# Patient Record
Sex: Male | Born: 1970 | Race: White | Hispanic: No | Smoking: Never smoker
Health system: Southern US, Community
[De-identification: ages and names within clinical notes are randomized; demographics above are authoritative.]

## PROBLEM LIST (undated history)

## (undated) DIAGNOSIS — F329 Major depressive disorder, single episode, unspecified: Secondary | ICD-10-CM

## (undated) DIAGNOSIS — K219 Gastro-esophageal reflux disease without esophagitis: Secondary | ICD-10-CM

## (undated) DIAGNOSIS — F32A Depression, unspecified: Secondary | ICD-10-CM

## (undated) HISTORY — DX: Depression, unspecified: F32.A

## (undated) HISTORY — DX: Major depressive disorder, single episode, unspecified: F32.9

## (undated) HISTORY — DX: Gastro-esophageal reflux disease without esophagitis: K21.9

## (undated) HISTORY — PX: WISDOM TOOTH EXTRACTION: SHX21

---

## 2015-12-11 ENCOUNTER — Telehealth: Payer: Self-pay

## 2015-12-11 NOTE — Telephone Encounter (Signed)
Pre visit call completed 

## 2015-12-12 ENCOUNTER — Encounter: Payer: Self-pay | Admitting: Family Medicine

## 2015-12-12 ENCOUNTER — Ambulatory Visit (INDEPENDENT_AMBULATORY_CARE_PROVIDER_SITE_OTHER): Payer: Commercial Managed Care - PPO | Admitting: Family Medicine

## 2015-12-12 VITALS — BP 124/70 | HR 85 | Temp 98.3°F | Ht 71.0 in | Wt 193.0 lb

## 2015-12-12 DIAGNOSIS — Z13220 Encounter for screening for lipoid disorders: Secondary | ICD-10-CM

## 2015-12-12 DIAGNOSIS — E663 Overweight: Secondary | ICD-10-CM

## 2015-12-12 DIAGNOSIS — F411 Generalized anxiety disorder: Secondary | ICD-10-CM | POA: Diagnosis not present

## 2015-12-12 LAB — COMPREHENSIVE METABOLIC PANEL
ALT: 11 U/L (ref 0–53)
AST: 11 U/L (ref 0–37)
Albumin: 4.3 g/dL (ref 3.5–5.2)
Alkaline Phosphatase: 35 U/L — ABNORMAL LOW (ref 39–117)
BUN: 10 mg/dL (ref 6–23)
CALCIUM: 9.3 mg/dL (ref 8.4–10.5)
CHLORIDE: 103 meq/L (ref 96–112)
CO2: 32 meq/L (ref 19–32)
CREATININE: 1.12 mg/dL (ref 0.40–1.50)
GFR: 75.03 mL/min (ref >60.00)
Glucose, Bld: 107 mg/dL — ABNORMAL HIGH (ref 70–99)
POTASSIUM: 4 meq/L (ref 3.5–5.1)
SODIUM: 141 meq/L (ref 135–145)
Total Bilirubin: 0.7 mg/dL (ref 0.2–1.2)
Total Protein: 7 g/dL (ref 6.0–8.3)

## 2015-12-12 LAB — LDL CHOLESTEROL, DIRECT: Direct LDL: 130 mg/dL

## 2015-12-12 LAB — LIPID PANEL
Cholesterol: 198 mg/dL (ref 0–200)
HDL: 36.4 mg/dL — AB (ref >39.00)
NONHDL: 161.82
TRIGLYCERIDES: 204 mg/dL — AB (ref 0.0–149.0)
Total CHOL/HDL Ratio: 5
VLDL: 40.8 mg/dL — AB (ref 0.0–40.0)

## 2015-12-12 MED ORDER — ESCITALOPRAM OXALATE 20 MG PO TABS: 20.0000 mg | ORAL_TABLET | Freq: Every day | ORAL | 3 refills | Status: DC

## 2015-12-12 MED ORDER — BUSPIRONE HCL 7.5 MG PO TABS: 7.5000 mg | ORAL_TABLET | Freq: Two times a day (BID) | ORAL | 1 refills | Status: DC

## 2015-12-12 NOTE — Progress Notes (Signed)
Chief Complaint  Patient presents with  . Establish Care    refill Lexapro       New Patient Visit SUBJECTIVE: HPI: William Alvarez is an 45 y.o.male who is being seen for establishing care.  The patient was previously seen at an office in [facility]. Recently moved.  Hx of anxiety for past 2-3 years. Takes Lexapro 20 mg daily for it, well controlled overall. Will have anxiety creep in 2-3 times per week where he wants something done for it. He has failed Zoloft in the past. +fam hx of depression with uncle. He does not follow with a counselor, psychologist or psychiatrist. No thoughts of harming self or others. He does not have panic attacks. He does not self-medicate with rx meds, illicit drugs or alcohol.   No Known Allergies  Past Medical History:  Diagnosis Date  . Depression   . GERD (gastroesophageal reflux disease)    Past Surgical History:  Procedure Laterality Date  . WISDOM TOOTH EXTRACTION     Social History   Social History  . Marital status: Divorced   Social History Main Topics  . Smoking status: Never Smoker  . Smokeless tobacco: Never Used  . Alcohol use Yes     Comment: socially  . Drug use:     Types: Methylphenidate   Family History  Problem Relation Age of Onset  . Alcohol abuse Mother   . Hypertension Mother   . Prostate cancer Father   . Alcohol abuse Father   . Hypertension Father   . Hypertension Maternal Grandmother   . Hypertension Maternal Grandfather   . Hypertension Paternal Grandmother   . Hypertension Paternal Grandfather      Current Outpatient Prescriptions:  .  escitalopram (LEXAPRO) 20 MG tablet, Take 1 tablet (20 mg total) by mouth daily., Disp: 90 tablet, Rfl: 3 .  esomeprazole (NEXIUM) 20 MG capsule, Take 20 mg by mouth daily at 12 noon., Disp: , Rfl:  .  busPIRone (BUSPAR) 7.5 MG tablet, Take 1 tablet (7.5 mg total) by mouth 2 (two) times daily., Disp: 60 tablet, Rfl: 1  ROS Cardiovascular: Denies chest pain  Respiratory: Denies  dyspnea   OBJECTIVE: BP 124/70 (BP Location: Left Arm, Patient Position: Sitting, Cuff Size: Small)   Pulse 85   Temp 98.3 F (36.8 C) (Oral)   Ht 5\' 11"  (1.803 m)   Wt 193 lb (87.5 kg)   SpO2 98%   BMI 26.92 kg/m   Constitutional: -  VS reviewed -  Well developed, well nourished, appears stated age -  No apparent distress  Psychiatric: -  Oriented to person, place, and time -  Memory intact -  Affect and mood normal -  Fluent conversation, good eye contact -  Judgment and insight age appropriate  Eye: -  Conjunctivae clear, no discharge -  Pupils symmetric, round, reactive to light  ENMT: -  Ears are patent b/l without erythema or discharge. TM's are shiny and clear b/l without evidence of effusion or infection. -  Oral mucosa without lesions, tongue and uvula midline    Tonsils not enlarged, no erythema, no exudate, trachea midline    Pharynx moist, no lesions, no erythema  Neck: -  No gross swelling, no palpable masses -  Thyroid midline, not enlarged, mobile, no palpable masses  Cardiovascular: -  RRR, no murmurs -  No LE edema  Respiratory: -  Normal respiratory effort, no accessory muscle use, no retraction -  Breath sounds equal, no wheezes, no  ronchi, no crackles  Musculoskeletal: -  No clubbing, no cyanosis -  Gait normal  Skin: -  No significant lesion on inspection -  Warm and dry to palpation   ASSESSMENT/PLAN: Generalized anxiety disorder - Plan: busPIRone (BUSPAR) 7.5 MG tablet, escitalopram (LEXAPRO) 20 MG tablet  Overweight (BMI 25.0-29.9) - Plan: Comprehensive metabolic panel  Screening cholesterol level - Plan: Lipid panel  Orders as above. Add Buspar. Number for counseling given. Patient should return in 1 mo- could go up on Buspar or try SNRI. The patient voiced understanding and agreement to the plan.   Jilda Rocheicholas Paul Albert LeaWendling, DO 12/12/15  11:00 AM

## 2015-12-12 NOTE — Progress Notes (Signed)
Pre visit review using our clinic review tool, if applicable. No additional management support is needed unless otherwise documented below in the visit note. 

## 2015-12-12 NOTE — Patient Instructions (Signed)
Please consider counseling. The medical literature and evidence-based guidelines support it. Contact [phone] to schedule an appointment or inquire about cost/insurance coverage.  

## 2016-02-11 ENCOUNTER — Other Ambulatory Visit: Payer: Self-pay | Admitting: Family Medicine

## 2016-02-11 DIAGNOSIS — F411 Generalized anxiety disorder: Secondary | ICD-10-CM

## 2016-02-11 NOTE — Telephone Encounter (Signed)
I have refilled Buspirone and sent Rx to [facility] pharmacy. TL/CMA

## 2016-02-21 ENCOUNTER — Encounter: Payer: Self-pay | Admitting: Family Medicine

## 2016-03-04 ENCOUNTER — Telehealth: Payer: Self-pay

## 2016-03-04 NOTE — Telephone Encounter (Signed)
Follow up call made to patient regarding Buspar. Left message for return call.

## 2016-03-04 NOTE — Telephone Encounter (Signed)
Returning call.

## 2016-03-05 NOTE — Telephone Encounter (Signed)
Patient returned my call. Called medication in to pharmacy. Advised patient to call pharmacy before he went to pick up. Patient agreed.

## 2016-03-05 NOTE — Telephone Encounter (Signed)
Called patient. Left message regarding Buspar. Checking to see if he was able to get medication.

## 2016-05-04 ENCOUNTER — Other Ambulatory Visit: Payer: Self-pay | Admitting: Family Medicine

## 2016-07-04 ENCOUNTER — Telehealth: Payer: Self-pay | Admitting: *Deleted

## 2016-07-04 DIAGNOSIS — F411 Generalized anxiety disorder: Secondary | ICD-10-CM

## 2016-07-04 NOTE — Telephone Encounter (Signed)
Requesting Buspirone HCL 7.5mg -Take 1 tablet by mouth 2 times daily. Last filled:02/11/16;#60,1 Last OV:12/22/15 Please advise.//AB/CMA

## 2016-07-04 NOTE — Telephone Encounter (Signed)
Ok to give 30 days, needs appt at least for a 6 mo check, he was supposed to follow up in 1 mo a while ago. TY.

## 2016-07-07 MED ORDER — BUSPIRONE HCL 7.5 MG PO TABS: 7.5000 mg | ORAL_TABLET | Freq: Two times a day (BID) | ORAL | 0 refills | Status: DC

## 2016-07-07 NOTE — Addendum Note (Signed)
Addended by: Verdie ShireBAYNES, ANGELA M on: 07/07/2016 10:07 AM   Modules accepted: Orders

## 2016-07-07 NOTE — Telephone Encounter (Signed)
Rx approved and sent to the pharmacy by e-script.  Called and spoke with the pt and informed him of the message below.  Pt verbalized understanding and agreed.  Pt was scheduled for follow-up for (Wed-07/16/16 @ 1:15pm).//AB/CMA

## 2016-07-16 ENCOUNTER — Ambulatory Visit: Payer: Commercial Managed Care - PPO | Admitting: Family Medicine

## 2016-07-23 ENCOUNTER — Ambulatory Visit (HOSPITAL_BASED_OUTPATIENT_CLINIC_OR_DEPARTMENT_OTHER)
Admission: RE | Admit: 2016-07-23 | Discharge: 2016-07-23 | Disposition: A | Discharge status: Home / Self Care | Payer: Commercial Managed Care - PPO | Source: Ambulatory Visit | Attending: Family Medicine | Admitting: Family Medicine

## 2016-07-23 ENCOUNTER — Ambulatory Visit (INDEPENDENT_AMBULATORY_CARE_PROVIDER_SITE_OTHER): Payer: Commercial Managed Care - PPO | Admitting: Family Medicine

## 2016-07-23 ENCOUNTER — Encounter: Payer: Self-pay | Admitting: Family Medicine

## 2016-07-23 VITALS — BP 120/70 | HR 104 | Temp 98.4°F | Ht 71.0 in | Wt 197.4 lb

## 2016-07-23 DIAGNOSIS — F411 Generalized anxiety disorder: Secondary | ICD-10-CM | POA: Diagnosis not present

## 2016-07-23 DIAGNOSIS — M25512 Pain in left shoulder: Secondary | ICD-10-CM | POA: Insufficient documentation

## 2016-07-23 DIAGNOSIS — S4992XA Unspecified injury of left shoulder and upper arm, initial encounter: Secondary | ICD-10-CM | POA: Diagnosis not present

## 2016-07-23 MED ORDER — BUSPIRONE HCL 7.5 MG PO TABS: 7.5000 mg | ORAL_TABLET | Freq: Two times a day (BID) | ORAL | 1 refills | Status: DC

## 2016-07-23 MED ORDER — PREDNISONE 10 MG (21) PO TBPK: ORAL_TABLET | ORAL | 0 refills | Status: DC

## 2016-07-23 MED ORDER — HYDROCODONE-ACETAMINOPHEN 5-325 MG PO TABS: ORAL_TABLET | ORAL | 0 refills | Status: DC

## 2016-07-23 NOTE — Progress Notes (Signed)
Musculoskeletal Exam  Patient: William Alvarez DOB: 05-Mar-1970  DOS: 07/23/2016  SUBJECTIVE:  Chief Complaint:   Chief Complaint  Patient presents with  . Follow-up    6 mos on medication  . Shoulder Injury    (L)-fell back and hit elbow-last Friday    William Alvarez is a 46 y.o.  male for evaluation and treatment of L shoulder pain.   Onset:  5 days ago.  Fell on elbow and having L shoulder pain.  Location: Top of L shoulder Character:  dull, achy Progression of issue:  is unchanged Associated symptoms: Decreased range of motion Treatment: to date has been OTC NSAIDS and acetaminophen.   Neurovascular symptoms: no  Anxiety Patient was seen in around 6 months ago and was taking Lexapro 20 mg daily. He was tolerating this well but felt he needed more relief. He was started BuSpar 7.5 mg BID. He feels much improved from this. He wishes to continue on his current regimen. No thoughts of harming himself or others. No self medication.  ROS:  Musculoskeletal/Extremities: +L shoulder pain  Neurologic: no numbness, tingling no weakness   Past Medical History:  Diagnosis Date  . Depression   . GERD (gastroesophageal reflux disease)    Past Surgical History:  Procedure Laterality Date  . WISDOM TOOTH EXTRACTION     Family History  Problem Relation Age of Onset  . Alcohol abuse Mother   . Hypertension Mother   . Prostate cancer Father   . Alcohol abuse Father   . Hypertension Father   . Hypertension Maternal Grandmother   . Hypertension Maternal Grandfather   . Hypertension Paternal Grandmother   . Hypertension Paternal Grandfather    Current Outpatient Prescriptions  Medication Sig Dispense Refill  . busPIRone (BUSPAR) 7.5 MG tablet Take 1 tablet (7.5 mg total) by mouth 2 (two) times daily. 30 tablet 0  . escitalopram (LEXAPRO) 20 MG tablet Take 1 tablet (20 mg total) by mouth daily. 90 tablet 3  . esomeprazole (NEXIUM) 20 MG capsule Take 20 mg by mouth daily at 12 noon.      No Known Allergies Social History   Social History  . Marital status: Divorced   Social History Main Topics  . Smoking status: Never Smoker  . Smokeless tobacco: Never Used  . Alcohol use Yes     Comment: socially  . Drug use: Yes    Types: Methylphenidate   Objective: VITAL SIGNS: BP 120/70 (BP Location: Left Arm, Patient Position: Sitting, Cuff Size: Normal)   Pulse (!) 104   Temp 98.4 F (36.9 C) (Oral)   Ht 5\' 11"  (1.803 m)   Wt 197 lb 6.4 oz (89.5 kg)   SpO2 98%   BMI 27.53 kg/m  Constitutional: Well formed, well developed. No acute distress. Cardiovascular: Brisk cap refill Thorax & Lungs: No accessory muscle use Extremities: No clubbing. No cyanosis. No edema.  Skin: Warm. Dry. No erythema. No rash.  Musculoskeletal: L shoulder- very limited due to pain.   Normal active range of motion: no.   Normal passive range of motion: no Tenderness to palpation: yes Deformity: no Ecchymosis: no Tests positive: +Neer's, Cross over, Hawkins, Empty can Tests negative: Lift off, Speed's, Obriens and cross over Obrien's Neurologic: Normal sensory function. No focal deficits noted. DTR's equal and symmetry in UE's. No clonus. Psychiatric: Normal mood. Age appropriate judgment and insight. Alert & oriented x 3.    Assessment:  Acute pain of left shoulder - Plan: DG Shoulder Left,  predniSONE (STERAPRED UNI-PAK 21 TAB) 10 MG (21) TBPK tablet, HYDROcodone-acetaminophen (NORCO/VICODIN) 5-325 MG tablet  Generalized anxiety disorder - Plan: busPIRone (BUSPAR) 7.5 MG tablet  Plan: Orders as above. Norco for breakthrough pain only. XR neg. F/u in 1 week if no improvement and we will consider injection vs referral to Sports medicine. F/u in 6 mo otherwise. The patient voiced understanding and agreement to the plan.   Jilda Rocheicholas Paul [place], DO 07/23/16  10:35 AM

## 2016-07-23 NOTE — Patient Instructions (Addendum)
EXERCISES  RANGE OF MOTION (ROM) AND STRETCHING EXERCISES These exercises may help you when beginning to rehabilitate your injury. Your symptoms may resolve with or without further involvement from your physician, physical therapist or athletic trainer. While completing these exercises, remember:   Restoring tissue flexibility helps normal motion to return to the joints. This allows healthier, less painful movement and activity.  An effective stretch should be held for at least 30 seconds.  A stretch should never be painful. You should only feel a gentle lengthening or release in the stretched tissue.  ROM - Pendulum  Bend at the waist so that your right / left arm falls away from your body. Support yourself with your opposite hand on a solid surface, such as a table or a countertop.  Your right / left arm should be perpendicular to the ground. If it is not perpendicular, you need to lean over farther. Relax the muscles in your right / left arm and shoulder as much as possible.  Gently sway your hips and trunk so they move your right / left arm without any use of your right / left shoulder muscles.  Progress your movements so that your right / left arm moves side to side, then forward and backward, and finally, both clockwise and counterclockwise.  Complete 10-15 repetitions in each direction. Many people use this exercise to relieve discomfort in their shoulder as well as to gain range of motion. Repeat 2-3 times. Complete this exercise once per day.  STRETCH - Flexion, Standing  Stand with good posture. With an underhand grip on your right / left hand and an overhand grip on the opposite hand, grasp a broomstick or cane so that your hands are a little more than shoulder-width apart.  Keeping your right / left elbow straight and shoulder muscles relaxed, push the stick with your opposite hand to raise your right / left arm in front of your body and then overhead. Raise your arm until you  feel a stretch in your right / left shoulder, but before you have increased shoulder pain.  Try to avoid shrugging your right / left shoulder as your arm rises by keeping your shoulder blade tucked down and toward your mid-back spine. Hold 15-20 seconds.  Slowly return to the starting position. Repeat 2-3 times. Complete this exercise once per day.  STRETCH - Internal Rotation  Place your right / left hand behind your back, palm-up.  Throw a towel or belt over your opposite shoulder. Grasp the towel/belt with your right / left hand.  While keeping an upright posture, gently pull up on the towel/belt until you feel a stretch in the front of your right / left shoulder.  Avoid shrugging your right / left shoulder as your arm rises by keeping your shoulder blade tucked down and toward your mid-back spine.  Hold 15-20. Release the stretch by lowering your opposite hand. Repeat 2-3 times. Complete this exercise once per day.  STRETCH - External Rotation and Abduction  Stagger your stance through a doorframe. It does not matter which foot is forward.  As instructed by your physician, physical therapist or athletic trainer, place your hands: ? And forearms above your head and on the door frame. ? And forearms at head-height and on the door frame. ? At elbow-height and on the door frame.  Keeping your head and chest upright and your stomach muscles tight to prevent over-extending your low-back, slowly shift your weight onto your front foot until you feel a   stretch across your chest and/or in the front of your shoulders.  Hold 15-20 seconds. Shift your weight to your back foot to release the stretch. Repeat 2-3 times. Complete this stretch once per day.   STRENGTHENING EXERCISES  These exercises may help you when beginning to rehabilitate your injury. They may resolve your symptoms with or without further involvement from your physician, physical therapist or athletic trainer. While  completing these exercises, remember:   Muscles can gain both the endurance and the strength needed for everyday activities through controlled exercises.  Complete these exercises as instructed by your physician, physical therapist or athletic trainer. Progress the resistance and repetitions only as guided.  You may experience muscle soreness or fatigue, but the pain or discomfort you are trying to eliminate should never worsen during these exercises. If this pain does worsen, stop and make certain you are following the directions exactly. If the pain is still present after adjustments, discontinue the exercise until you can discuss the trouble with your clinician.  If advised by your physician, during your recovery, avoid activity or exercises which involve actions that place your right / left hand or elbow above your head or behind your back or head. These positions stress the tissues which are trying to heal.  STRENGTH - Scapular Depression and Adduction  With good posture, sit on a firm chair. Supported your arms in front of you with pillows, arm rests or a table top. Have your elbows in line with the sides of your body.  Gently draw your shoulder blades down and toward your mid-back spine. Gradually increase the tension without tensing the muscles along the top of your shoulders and the back of your neck.  Hold for 5 seconds. Slowly release the tension and relax your muscles completely before completing the next repetition.  After you have practiced this exercise, remove the arm support and complete it in standing as well as sitting. Repeat 2 times. Complete this exercise every other day.   STRENGTH - External Rotators  Secure a rubber exercise band/tubing to a fixed object so that it is at the same height as your right / left elbow when you are standing or sitting on a firm surface.  Stand or sit so that the secured exercise band/tubing is at your side that is not injured.  Bend your  elbow 90 degrees. Place a folded towel or small pillow under your right / left arm so that your elbow is a few inches away from your side.  Keeping the tension on the exercise band/tubing, pull it away from your body, as if pivoting on your elbow. Be sure to keep your body steady so that the movement is only coming from your shoulder rotating.  Hold 5 seconds. Release the tension in a controlled manner as you return to the starting position. Repeat 2 times. Complete this exercise every other day.   STRENGTH - Supraspinatus  Stand or sit with good posture. Grasp a 2-3 lb weight or an exercise band/tubing so that your hand is "thumbs-up," like when you shake hands.  Slowly lift your right / left hand from your thigh into the air, traveling about 30 degrees from straight out at your side. Lift your hand to shoulder height or as far as you can without increasing any shoulder pain. Initially, many people do not lift their hands above shoulder height.  Avoid shrugging your right / left shoulder as your arm rises by keeping your shoulder blade tucked down and toward   your mid-back spine.  Hold for 4-5 seconds. Control the descent of your hand as you slowly return to your starting position. Repeat 2 times. Complete this exercise every other day.   STRENGTH - Shoulder Extensors  Secure a rubber exercise band/tubing so that it is at the height of your shoulders when you are either standing or sitting on a firm arm-less chair.  With a thumbs-up grip, grasp an end of the band/tubing in each hand. Straighten your elbows and lift your hands straight in front of you at shoulder height. Step back away from the secured end of band/tubing until it becomes tense.  Squeezing your shoulder blades together, pull your hands down to the sides of your thighs. Do not allow your hands to go behind you.  Hold for 5 seconds. Slowly ease the tension on the band/tubing as you reverse the directions and return to the  starting position. Repeat 2 times. Complete this exercise every other day.   STRENGTH - Scapular Retractors  Secure a rubber exercise band/tubing so that it is at the height of your shoulders when you are either standing or sitting on a firm arm-less chair.  With a palm-down grip, grasp an end of the band/tubing in each hand. Straighten your elbows and lift your hands straight in front of you at shoulder height. Step back away from the secured end of band/tubing until it becomes tense.  Squeezing your shoulder blades together, draw your elbows back as you bend them. Keep your upper arm lifted away from your body throughout the exercise.  Hold 5 seconds. Slowly ease the tension on the band/tubing as you reverse the directions and return to the starting position. Repeat 2 times. Complete this exercise every other day.  STRENGTH - Scapular Depressors  Find a sturdy chair without wheels, such as a from a dining room table.  Keeping your feet on the floor, lift your bottom from the seat and lock your elbows.  Keeping your elbows straight, allow gravity to pull your body weight down. Your shoulders will rise toward your ears.  Raise your body against gravity by drawing your shoulder blades down your back, shortening the distance between your shoulders and ears. Although your feet should always maintain contact with the floor, your feet should progressively support less body weight as you get stronger.  Hold 5 seconds. In a controlled and slow manner, lower your body weight to begin the next repetition. Repeat 2 times. Complete this exercise every other day.   This information is not intended to replace advice given to you by your health care provider. Make sure you discuss any questions you have with your health care provider.  Document Released: 12/04/2004 Document Revised: 02/10/2014 Document Reviewed: 05/04/2008 Elsevier Interactive Patient Education 2016 [employer]  Heat (pad or  rice pillow in microwave) over affected area, 10-15 minutes every 2-3 hours while awake.   OK to take Tylenol 1000 mg (2 extra strength tabs) or 975 mg (3 regular strength tabs) every 6 hours as needed.

## 2016-09-25 ENCOUNTER — Other Ambulatory Visit: Payer: Self-pay | Admitting: Family Medicine

## 2016-09-25 DIAGNOSIS — F411 Generalized anxiety disorder: Secondary | ICD-10-CM

## 2016-09-25 MED ORDER — BUSPIRONE HCL 7.5 MG PO TABS: 7.5000 mg | ORAL_TABLET | Freq: Two times a day (BID) | ORAL | 1 refills | Status: DC

## 2017-01-09 ENCOUNTER — Other Ambulatory Visit: Payer: Self-pay | Admitting: Family Medicine

## 2017-01-09 DIAGNOSIS — F411 Generalized anxiety disorder: Secondary | ICD-10-CM

## 2017-03-15 DIAGNOSIS — Z23 Encounter for immunization: Secondary | ICD-10-CM | POA: Diagnosis not present

## 2017-08-02 ENCOUNTER — Other Ambulatory Visit: Payer: Self-pay | Admitting: Family Medicine

## 2017-08-02 DIAGNOSIS — F411 Generalized anxiety disorder: Secondary | ICD-10-CM

## 2017-09-09 ENCOUNTER — Other Ambulatory Visit: Payer: Self-pay | Admitting: Family Medicine

## 2017-09-09 DIAGNOSIS — F411 Generalized anxiety disorder: Secondary | ICD-10-CM

## 2017-10-20 ENCOUNTER — Other Ambulatory Visit: Payer: Self-pay | Admitting: Family Medicine

## 2017-10-20 DIAGNOSIS — F411 Generalized anxiety disorder: Secondary | ICD-10-CM

## 2017-10-23 DIAGNOSIS — Z23 Encounter for immunization: Secondary | ICD-10-CM | POA: Diagnosis not present

## 2017-11-13 ENCOUNTER — Other Ambulatory Visit: Payer: Self-pay | Admitting: Family Medicine

## 2017-11-13 DIAGNOSIS — F411 Generalized anxiety disorder: Secondary | ICD-10-CM

## 2017-11-17 IMAGING — DX DG SHOULDER 2+V*L*
3 series · 3 of 3 positions shown · non-contrast
Comparison: None.

CLINICAL DATA: Pain following fall

EXAM:
LEFT SHOULDER - 2+ VIEW

[shoulder grashey]
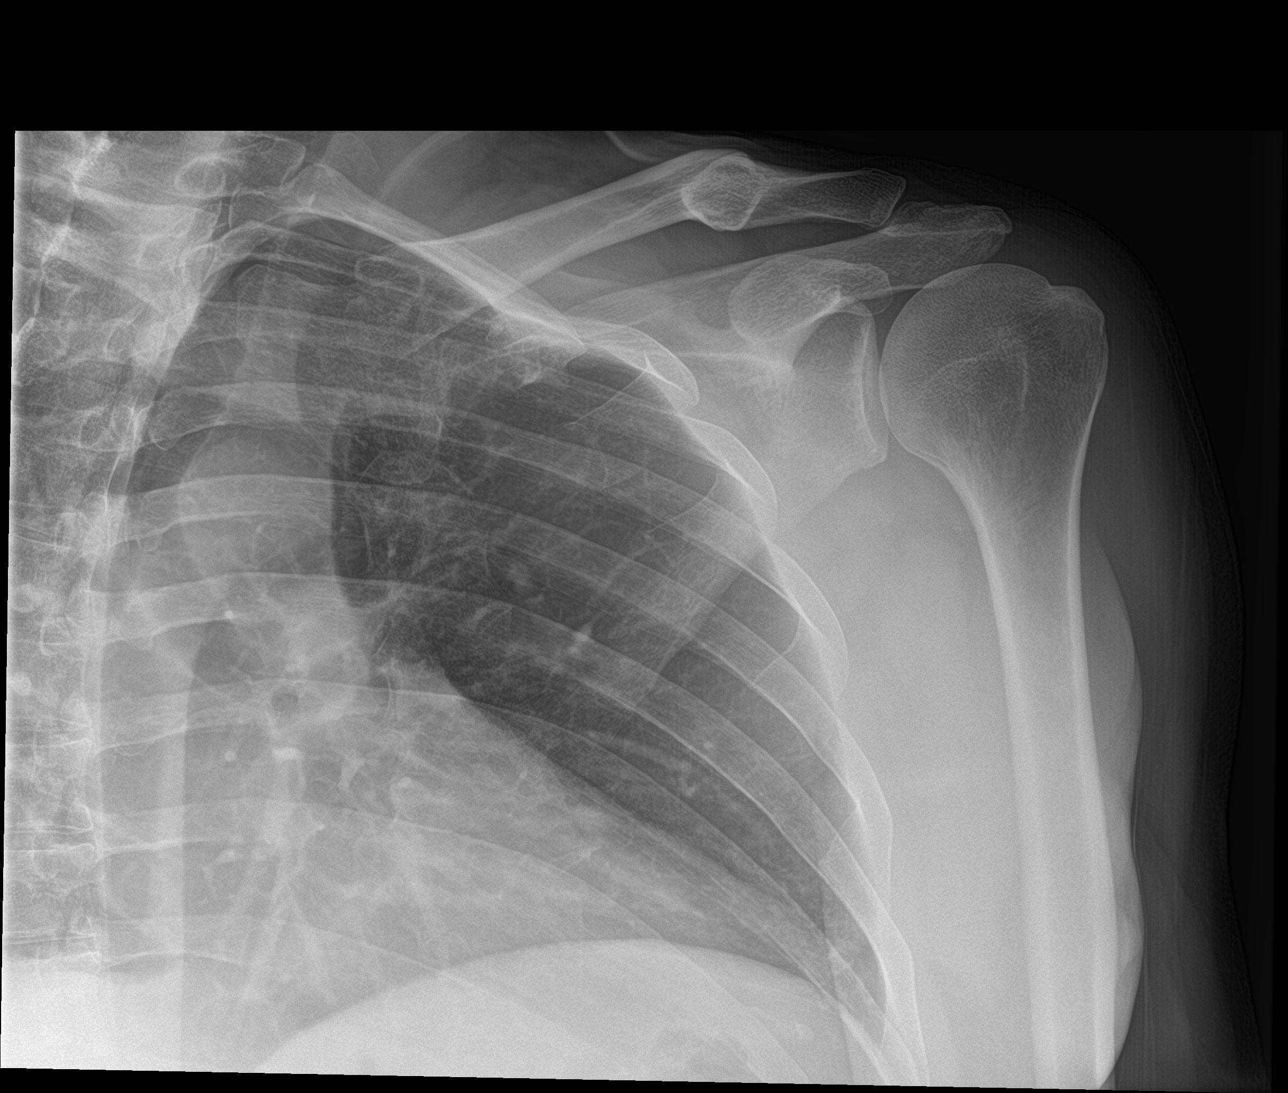

[shoulder y view]
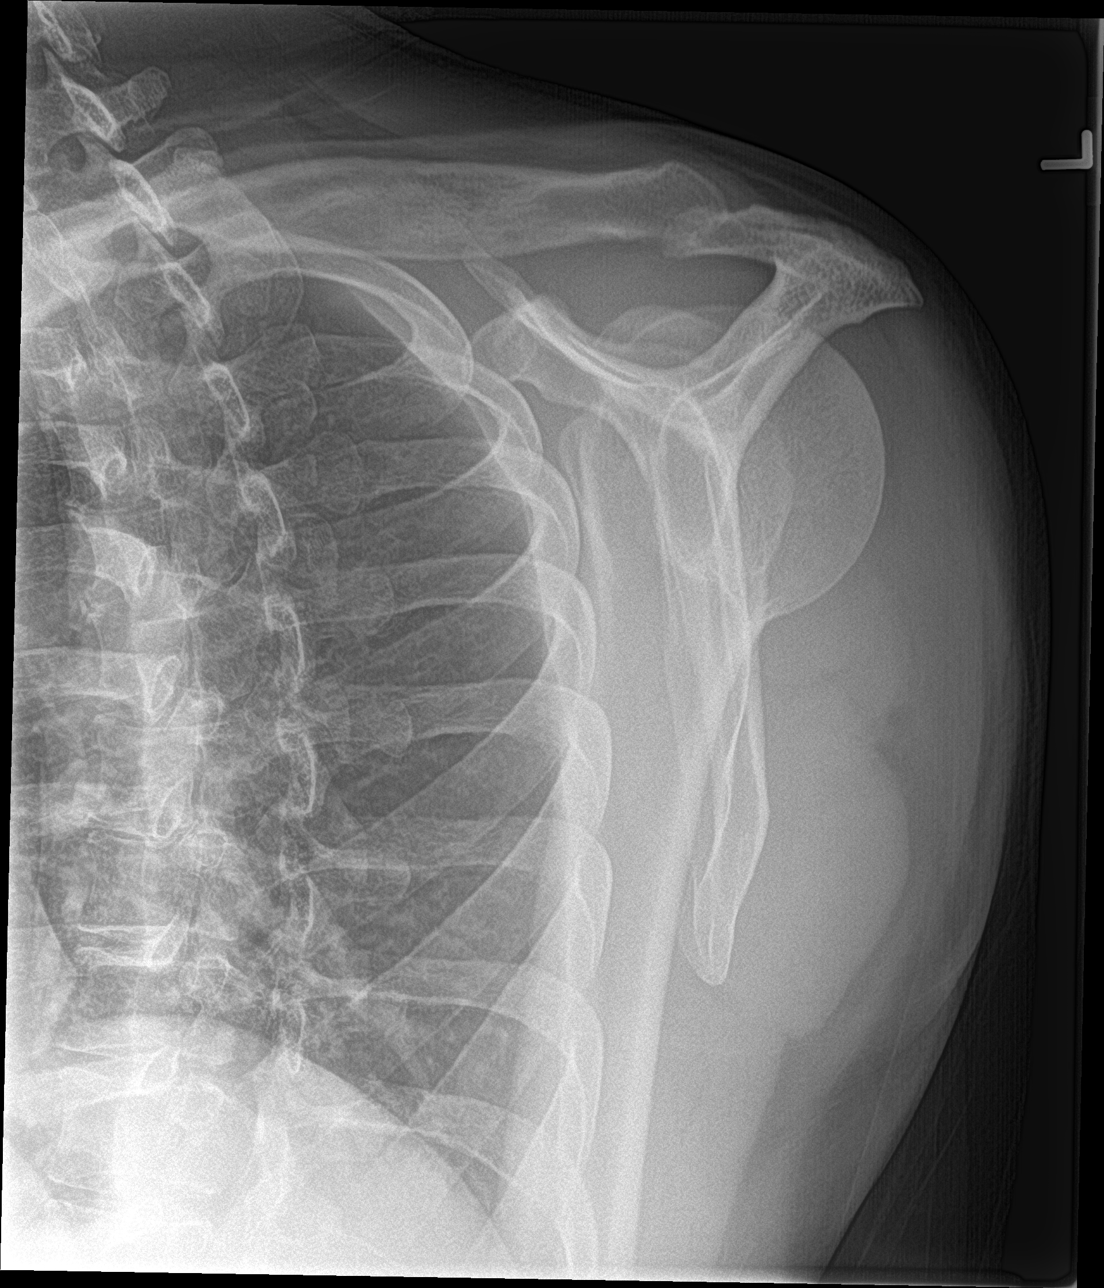

[shoulder axillary]
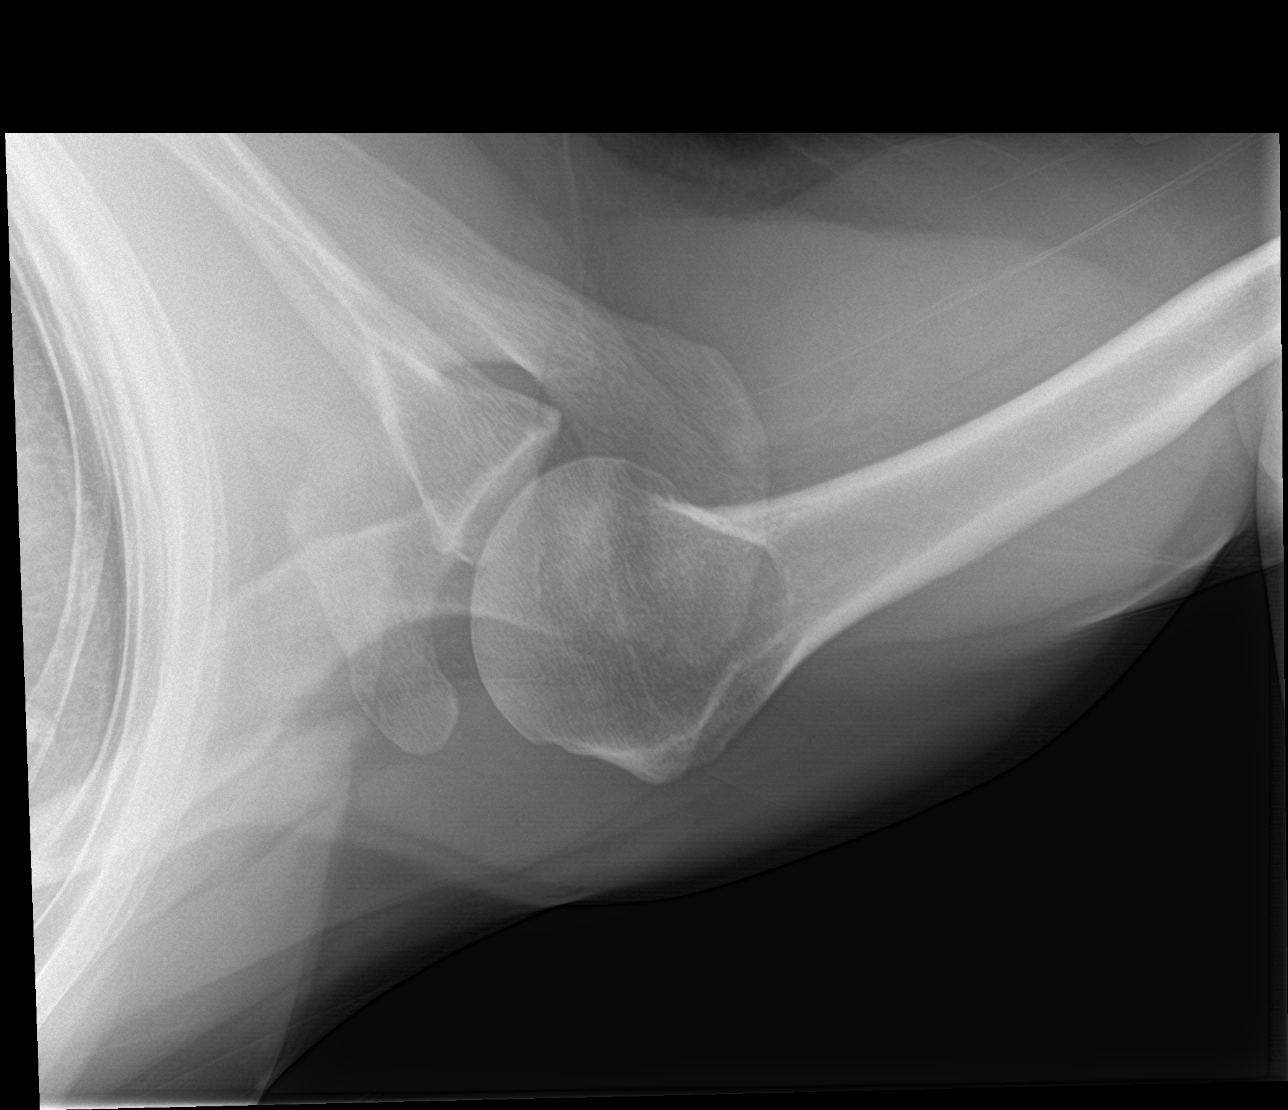

[3 of 3 positions shown; findings below may reference images not displayed]

FINDINGS: Oblique, Y scapular, and axillary images were obtained. There is no
fracture or dislocation. Joint spaces appear normal. No erosive
change. Visualized left lung is clear.
IMPRESSION: No fracture or dislocation.  No appreciable arthropathy.

## 2017-12-12 ENCOUNTER — Other Ambulatory Visit: Payer: Self-pay | Admitting: Family Medicine

## 2017-12-12 DIAGNOSIS — F411 Generalized anxiety disorder: Secondary | ICD-10-CM

## 2017-12-22 ENCOUNTER — Other Ambulatory Visit: Payer: Self-pay | Admitting: Family Medicine

## 2017-12-22 DIAGNOSIS — F411 Generalized anxiety disorder: Secondary | ICD-10-CM

## 2017-12-22 NOTE — Telephone Encounter (Signed)
Dr Carmelia RollerWendling -- Last OV 07/11/16.  Please see refill protocol failures for Buspar and advise request?

## 2017-12-23 NOTE — Telephone Encounter (Signed)
Called the patient left a detailed message of PCP instructions regarding appt/refills

## 2017-12-23 NOTE — Telephone Encounter (Signed)
Needs appt, med ck. TY.

## 2018-01-26 ENCOUNTER — Other Ambulatory Visit: Payer: Self-pay | Admitting: Family Medicine

## 2018-01-26 DIAGNOSIS — F411 Generalized anxiety disorder: Secondary | ICD-10-CM

## 2018-02-14 ENCOUNTER — Other Ambulatory Visit: Payer: Self-pay | Admitting: Family Medicine

## 2018-02-14 DIAGNOSIS — F411 Generalized anxiety disorder: Secondary | ICD-10-CM

## 2018-03-01 ENCOUNTER — Other Ambulatory Visit: Payer: Self-pay | Admitting: Family Medicine

## 2018-03-01 DIAGNOSIS — F411 Generalized anxiety disorder: Secondary | ICD-10-CM

## 2018-04-03 ENCOUNTER — Other Ambulatory Visit: Payer: Self-pay | Admitting: Family Medicine

## 2018-04-03 DIAGNOSIS — F411 Generalized anxiety disorder: Secondary | ICD-10-CM

## 2018-05-04 ENCOUNTER — Encounter: Payer: Self-pay | Admitting: Family Medicine

## 2018-05-05 ENCOUNTER — Ambulatory Visit (INDEPENDENT_AMBULATORY_CARE_PROVIDER_SITE_OTHER): Payer: Commercial Managed Care - PPO | Admitting: Family Medicine

## 2018-05-05 ENCOUNTER — Other Ambulatory Visit: Payer: Self-pay

## 2018-05-05 ENCOUNTER — Encounter: Payer: Self-pay | Admitting: Family Medicine

## 2018-05-05 DIAGNOSIS — H9202 Otalgia, left ear: Secondary | ICD-10-CM | POA: Diagnosis not present

## 2018-05-05 MED ORDER — AMOXICILLIN 875 MG PO TABS: 875.0000 mg | ORAL_TABLET | Freq: Two times a day (BID) | ORAL | 0 refills | Status: AC

## 2018-05-05 MED ORDER — PREDNISONE 20 MG PO TABS: 40.0000 mg | ORAL_TABLET | Freq: Every day | ORAL | 0 refills | Status: AC

## 2018-05-05 NOTE — Progress Notes (Signed)
Virtual Visit via Video Note  I connected with William Alvarez on 05/05/18 at 10:00 AM EDT by a video enabled telemedicine application and verified that I am speaking with the correct person using two identifiers.   I discussed the limitations of evaluation and management by telemedicine and the availability of in person appointments. The patient expressed understanding and agreed to proceed.  History of Present Illness: 1 d of L ear pain, no drainage. Tried to clean with peroxide, did not help despite getting some wax out. No drainage, fevers, URI s/s's. Does have hx of AOM as adult every 1-3 years.    Observations/Objective: No conversational dyspnea Age appropriate judgment and insight Nml affect and mood  Assessment and Plan: Left ear pain - Plan: amoxicillin (AMOXIL) 875 MG tablet, predniSONE (DELTASONE) 20 MG tablet  Cover for AOM and ETD. Will also send message with ear wax removal instructions. Let us know if anything changes.  Follow Up Instructions: PRN.    I discussed the assessment and treatment plan with the patient. The patient was provided an opportunity to ask questions and all were answered. The patient agreed with the plan and demonstrated an understanding of the instructions.   The patient was advised to call back or seek an in-person evaluation if the symptoms worsen or if the condition fails to improve as anticipated.   Jilda Roche [place], DO

## 2018-08-04 ENCOUNTER — Ambulatory Visit (INDEPENDENT_AMBULATORY_CARE_PROVIDER_SITE_OTHER): Payer: Commercial Managed Care - PPO | Admitting: Family Medicine

## 2018-08-04 ENCOUNTER — Other Ambulatory Visit: Payer: Self-pay

## 2018-08-04 ENCOUNTER — Encounter: Payer: Self-pay | Admitting: Family Medicine

## 2018-08-04 VITALS — BP 112/84 | HR 101 | Temp 98.5°F | Resp 16 | Ht 71.0 in | Wt 196.0 lb

## 2018-08-04 DIAGNOSIS — L989 Disorder of the skin and subcutaneous tissue, unspecified: Secondary | ICD-10-CM | POA: Diagnosis not present

## 2018-08-04 DIAGNOSIS — L219 Seborrheic dermatitis, unspecified: Secondary | ICD-10-CM

## 2018-08-04 DIAGNOSIS — G4700 Insomnia, unspecified: Secondary | ICD-10-CM | POA: Diagnosis not present

## 2018-08-04 MED ORDER — TRIAMCINOLONE ACETONIDE 0.1 % EX CREA: 1.0000 "application " | TOPICAL_CREAM | Freq: Two times a day (BID) | CUTANEOUS | 0 refills | Status: DC

## 2018-08-04 MED ORDER — TRAZODONE HCL 50 MG PO TABS: 25.0000 mg | ORAL_TABLET | Freq: Every evening | ORAL | 3 refills | Status: DC | PRN

## 2018-08-04 MED ORDER — KETOCONAZOLE 2 % EX SHAM: MEDICATED_SHAMPOO | CUTANEOUS | 0 refills | Status: DC

## 2018-08-04 NOTE — Progress Notes (Signed)
Chief Complaint  Patient presents with  . Skin Lesion    chest, 15+ years, no change in size or color  . Skin Dryness    William Alvarez is a 48 y.o. male here for a skin complaint.  Duration: 18 years Location: upper chest Pruritic? No Painful? No Drainage? No Other associated symptoms: none Therapies tried thus far: None  Also has been having issues with his outer ears being dry. Has tried OTC cortisone cream with some relief. It is scaly and itchy. Has been going on for many months. Had Ciprodex drops that did help, tho was rx'd for another issue. Also tried anti-dandruff tar shampoo that was not particularly helpful.   Over past several mo, since pandemic started, has been having issues staying asleep. Has tried various OTC meds without relief. Was on a couple Z drugs in past. Mind will race and he has been worrying. Hoping to get a rx for something to help.   ROS:  Const: No fevers Skin: As noted in HPI  Past Medical History:  Diagnosis Date  . Depression   . GERD (gastroesophageal reflux disease)     BP 112/84 (BP Location: Right Arm, Patient Position: Sitting, Cuff Size: Large)   Pulse (!) 101   Temp 98.5 F (36.9 C) (Oral)   Resp 16   Ht 5\' 11"  (1.803 m)   Wt 196 lb (88.9 kg)   SpO2 99%   BMI 27.34 kg/m  Gen: awake, alert, appearing stated age Lungs: No accessory muscle use Skin: See below for chest lesion. No drainage, erythema, TTP, fluctuance, excoriation; outer ears scaly without erythema Ears: Canals patent with trace amounts of wax, TM's neg b/l Psych: Age appropriate judgment and insight  Skin lesion - Plan: picture taken today. Observation, if anything changes can shave.   Seborrhea - Plan: triamcinolone cream (KENALOG) 0.1 %, ketoconazole (NIZORAL) 2 % shampoo  Insomnia, unspecified type - Plan: traZODone (DESYREL) 50 MG tablet, sleep hygiene, [facility] info given.  F/u in 4 weeks.  The patient voiced understanding and agreement to the plan.  [place], DO 08/04/18 8:07 AM

## 2018-08-04 NOTE — Patient Instructions (Signed)
Apply the cream twice daily for 10 days.  Use the shampoo 2-3 times per week until it resolves. You may need to use it weekly for maintenance.  Sleep Hygiene Tips:  Do not watch TV or look at screens within 1 hour of going to bed. If you do, make sure there is a blue light filter (nighttime mode) involved.  Try to go to bed around the same time every night. Wake up at the same time within 1 hour of regular time. Ex: If you wake up at 7 AM for work, do not sleep past 8 AM on days that you don't work.  Do not drink alcohol before bedtime.  Do not consume caffeine-containing beverages after noon or within 9 hours of intended bedtime.  Get regular exercise/physical activity in your life, but not within 2 hours of planned bedtime.  Do not take naps.   Do not eat within 2 hours of planned bedtime.  Melatonin, 3-5 mg 30-60 minutes before planned bedtime may be helpful.   The bed should be for sleep or sex only. If after 20-30 minutes you are unable to fall asleep, get up and do something relaxing. Do this until you feel ready to go to sleep again.   Sleep is important to [place] all. Getting good sleep is imperative to adequate functioning during the day. Work with our counselors who are trained to help people obtain quality sleep. Call [phone] to schedule an appointment or if you are curious about insurance coverage/cost.  Aim to do some physical exertion for 150 minutes per week. This is typically divided into 5 days per week, 30 minutes per day. The activity should be enough to get your heart rate up. Anything is better than nothing if you have time constraints.  Let us know if you need anything.

## 2018-09-06 ENCOUNTER — Ambulatory Visit (INDEPENDENT_AMBULATORY_CARE_PROVIDER_SITE_OTHER): Payer: Commercial Managed Care - PPO | Admitting: Family Medicine

## 2018-09-06 ENCOUNTER — Other Ambulatory Visit: Payer: Self-pay

## 2018-09-06 ENCOUNTER — Encounter: Payer: Self-pay | Admitting: Family Medicine

## 2018-09-06 VITALS — BP 122/74 | HR 132 | Temp 99.0°F | Ht 71.0 in | Wt 194.4 lb

## 2018-09-06 DIAGNOSIS — Z23 Encounter for immunization: Secondary | ICD-10-CM

## 2018-09-06 DIAGNOSIS — R000 Tachycardia, unspecified: Secondary | ICD-10-CM

## 2018-09-06 DIAGNOSIS — F411 Generalized anxiety disorder: Secondary | ICD-10-CM

## 2018-09-06 DIAGNOSIS — G4700 Insomnia, unspecified: Secondary | ICD-10-CM

## 2018-09-06 MED ORDER — LORAZEPAM 0.5 MG PO TABS: 0.2500 mg | ORAL_TABLET | Freq: Every day | ORAL | 0 refills | Status: DC | PRN

## 2018-09-06 NOTE — Patient Instructions (Addendum)
Keep an eye on your pulse over next few weeks. If greater than 100 please let me know.  Coping skills Choose 5 that work for you:  Take a deep breath  Count to 20  Read a book  Do a puzzle  Meditate  Bake  Sing  Knit  Garden  Pray  Go outside  Call a friend  Listen to music  Take a walk  Color  Send a note  Take a bath  Watch a movie  Be alone in a quiet place  Pet an animal  Visit a friend  Journal  Exercise  Stretch   Let us know if you need anything.

## 2018-09-06 NOTE — Progress Notes (Signed)
Chief Complaint  Patient presents with  . Follow-up    Subjective: Patient is a 48 y.o. male here for f/u insomnia.  Patient was started on trazodone 50 mg nightly.  It helps him stay asleep.  He is content with this.  His ex-wife's brother recently passed away.  This has been causing him quite a bit of stress.  He is currently taking Lexapro daily.  He is no longer taking BuSpar.  He used to take Lorazepam for peaks of anxiety.  He is not having panic attacks.  He would like to restart this.  He is not following with a counselor or psychologist.  He has a high heart rate today.  He denies any palpitations, shortness of breath, or chest pain.  He reports the stress of a death near to him is causing his heart rate to be elevated.  He has the capability to monitor this at home.   ROS: Heart: Denies chest pain  Lungs: Denies SOB   Past Medical History:  Diagnosis Date  . Depression   . GERD (gastroesophageal reflux disease)     Objective: BP 122/74 (BP Location: Left Arm, Patient Position: Sitting, Cuff Size: Normal)   Pulse (!) 132   Temp 99 F (37.2 C) (Oral)   Ht 5\' 11"  (1.803 m)   Wt 194 lb 6 oz (88.2 kg)   SpO2 95%   BMI 27.11 kg/m  General: Awake, appears stated age HEENT: MMM, EOMi Heart: Regular rhythm, tachycardic, no murmurs Lungs: CTAB, no rales, wheezes or rhonchi. No accessory muscle use Psych: Age appropriate judgment and insight, normal affect and mood  Assessment and Plan: GAD (generalized anxiety disorder) - Plan: Continue Lexapro, add Lorazepam as needed  Insomnia, unspecified type - Plan: Continue trazodone  Tachycardia - Plan: Monitor at home, if unremarkable, will obtain thyroid level, CBC, and EKG.  Need for tetanus booster - Plan: Tdap vaccine greater than or equal to 7yo IM  Follow-up in 2 months. The patient voiced understanding and agreement to the plan.  [place], DO 09/06/18  10:53 AM

## 2018-10-04 ENCOUNTER — Other Ambulatory Visit: Payer: Self-pay | Admitting: Family Medicine

## 2018-10-05 NOTE — Telephone Encounter (Signed)
Last OV--09/06/2018 Last RF---09/06/2018---#30 no refills

## 2018-11-01 ENCOUNTER — Other Ambulatory Visit: Payer: Self-pay | Admitting: Family Medicine

## 2018-11-02 NOTE — Telephone Encounter (Signed)
Requesting:  Lorazepam Contract:   NONE UDS:   NONE Last Visit:   09/06/2018 Next Visit:   NONE Last Refill:   10/05/2018   #30 no refills.  Please Advise

## 2018-11-08 ENCOUNTER — Ambulatory Visit: Payer: Commercial Managed Care - PPO | Admitting: Family Medicine

## 2018-11-26 ENCOUNTER — Other Ambulatory Visit: Payer: Self-pay | Admitting: Family Medicine

## 2018-11-26 DIAGNOSIS — G4700 Insomnia, unspecified: Secondary | ICD-10-CM

## 2018-11-26 NOTE — Telephone Encounter (Signed)
Requesting:   Trazodone Contract:   none UDS:   none Last Visit:   09/06/2018 Next Visit:  none Last Refill:   04/10/2019-----#30 with 3 refills.  Please Advise

## 2018-12-14 ENCOUNTER — Other Ambulatory Visit: Payer: Self-pay | Admitting: Family Medicine

## 2018-12-15 NOTE — Telephone Encounter (Signed)
Requesting:   Lorazepam Contract:   None    UDS:     None Last Visit:    09/06/2018 Next Visit:     None Last Refill:    11/02/2018  #30 no refills  Please Advise

## 2018-12-16 NOTE — Telephone Encounter (Signed)
Will refill but he was supposed to follow up in early October. Please schedule his f/u appt. Ty.

## 2018-12-22 ENCOUNTER — Other Ambulatory Visit: Payer: Self-pay | Admitting: Family Medicine

## 2018-12-22 DIAGNOSIS — G4700 Insomnia, unspecified: Secondary | ICD-10-CM

## 2019-01-10 ENCOUNTER — Other Ambulatory Visit: Payer: Self-pay | Admitting: Family Medicine

## 2019-01-10 DIAGNOSIS — L219 Seborrheic dermatitis, unspecified: Secondary | ICD-10-CM

## 2019-01-14 ENCOUNTER — Other Ambulatory Visit: Payer: Self-pay | Admitting: Family Medicine

## 2019-01-17 NOTE — Telephone Encounter (Signed)
Called informed the patient of PCP instructions. He stated he would schedule on line after the Christmas. He verbalized understanding.

## 2019-01-17 NOTE — Telephone Encounter (Signed)
Requesting:   Lorazepam Contract:  none UDS:    None Last Visit:    12/16/2018 Next Visit:    none Last Refill:     12/16/2018  Please Advise

## 2019-01-17 NOTE — Telephone Encounter (Signed)
Needs appt. Refilled some in meantime. Ty.

## 2019-01-20 ENCOUNTER — Other Ambulatory Visit: Payer: Self-pay | Admitting: Family Medicine

## 2019-01-20 DIAGNOSIS — G4700 Insomnia, unspecified: Secondary | ICD-10-CM

## 2019-01-20 NOTE — Telephone Encounter (Signed)
Last OV 09/06/18 Last refill 11/26/18 #30/3 Next OV not scheduled

## 2019-01-24 ENCOUNTER — Encounter: Payer: Self-pay | Admitting: Family Medicine

## 2019-01-31 ENCOUNTER — Encounter: Payer: Self-pay | Admitting: Family Medicine

## 2019-01-31 ENCOUNTER — Other Ambulatory Visit: Payer: Self-pay

## 2019-02-01 ENCOUNTER — Ambulatory Visit (INDEPENDENT_AMBULATORY_CARE_PROVIDER_SITE_OTHER): Payer: Commercial Managed Care - PPO | Admitting: Family Medicine

## 2019-02-01 ENCOUNTER — Encounter: Payer: Self-pay | Admitting: Family Medicine

## 2019-02-01 DIAGNOSIS — G4700 Insomnia, unspecified: Secondary | ICD-10-CM

## 2019-02-01 DIAGNOSIS — F411 Generalized anxiety disorder: Secondary | ICD-10-CM | POA: Diagnosis not present

## 2019-02-01 DIAGNOSIS — J069 Acute upper respiratory infection, unspecified: Secondary | ICD-10-CM | POA: Diagnosis not present

## 2019-02-01 NOTE — Progress Notes (Signed)
No chief complaint on file.   Subjective: Patient is a 48 y.o. male here for f/u. Due to COVID-19 pandemic, we are interacting via web portal for an electronic face-to-face visit. I verified patient's ID using 2 identifiers. Patient agreed to proceed with visit via this method. Patient is at home, I am at office. Patient and I are present for visit.   Pt got sick around 8 d ago. Got tested 2x and was neg each time. Felt better after 1-2 d. Needs note to allow him to return to work. Never had a fever. Had general URI s/s's.  GAD Hx of GAD w insomnia. Compliant w Lexapro 20 mg/d. No AE's. Working well overall. Takes 0.5 mg of Ativan prn, usually more than 10 tabs/mo on average. He is taking trazodone in the evenings for sleep and this seems to be working well. Not currently following with a counselor or psychologist.   ROS: Const: no fevers Psych: +anxiety  Past Medical History:  Diagnosis Date  . Depression   . GERD (gastroesophageal reflux disease)     Objective: No conversational dyspnea Age appropriate judgment and insight Nml affect and mood  Assessment and Plan: GAD (generalized anxiety disorder)  Insomnia, unspecified type  Upper respiratory tract infection, unspecified type  1/2: cont Lexapro, trazodone, prn Ativan. Goal is 10 or less/month on average. If he asks for a refill before 03/20/19, will refill and send in BuSpar w 1 mo f/u. Will likely be able to sched CPE around that time also. 3- Form filled out for return to work.  F/u in around 3 mo otherwise.  The patient voiced understanding and agreement to the plan.  [place], DO 02/01/19  10:02 AM

## 2019-02-28 ENCOUNTER — Other Ambulatory Visit: Payer: Self-pay | Admitting: Family Medicine

## 2019-02-28 DIAGNOSIS — F411 Generalized anxiety disorder: Secondary | ICD-10-CM

## 2019-04-14 ENCOUNTER — Other Ambulatory Visit: Payer: Self-pay | Admitting: Family Medicine

## 2019-04-14 DIAGNOSIS — G4700 Insomnia, unspecified: Secondary | ICD-10-CM

## 2019-05-12 ENCOUNTER — Other Ambulatory Visit: Payer: Self-pay

## 2019-05-13 ENCOUNTER — Encounter: Payer: Self-pay | Admitting: Family Medicine

## 2019-05-13 ENCOUNTER — Ambulatory Visit (INDEPENDENT_AMBULATORY_CARE_PROVIDER_SITE_OTHER): Payer: Commercial Managed Care - PPO | Admitting: Family Medicine

## 2019-05-13 ENCOUNTER — Other Ambulatory Visit: Payer: Self-pay

## 2019-05-13 VITALS — BP 152/81 | HR 72 | Temp 99.3°F | Resp 14 | Ht 71.0 in | Wt 180.4 lb

## 2019-05-13 DIAGNOSIS — S46811A Strain of other muscles, fascia and tendons at shoulder and upper arm level, right arm, initial encounter: Secondary | ICD-10-CM

## 2019-05-13 MED ORDER — LORAZEPAM 0.5 MG PO TABS: 0.2500 mg | ORAL_TABLET | Freq: Every day | ORAL | 2 refills | Status: AC | PRN

## 2019-05-13 NOTE — Patient Instructions (Addendum)
Heat (pad or rice pillow in microwave) over affected area, 10-15 minutes twice daily.   Ice/cold pack over area for 10-15 min twice daily.  OK to take Tylenol 1000 mg (2 extra strength tabs) or 975 mg (3 regular strength tabs) every 6 hours as needed.  Ibuprofen 400-600 mg (2-3 over the counter strength tabs) every 6 hours as needed for pain.  Send me a message in 3 weeks if no better.  Trapezius stretches/exercises Do exercises exactly as told by your health care provider and adjust them as directed. It is normal to feel mild stretching, pulling, tightness, or discomfort as you do these exercises, but you should stop right away if you feel sudden pain or your pain gets worse.  Stretching and range of motion exercises These exercises warm up your muscles and joints and improve the movement and flexibility of your shoulder. These exercises can also help to relieve pain, numbness, and tingling. If you are unable to do any of the following for any reason, do not further attempt to do it.   Exercise A: Flexion, standing    1. Stand and hold a broomstick, a cane, or a similar object. Place your hands a little more than shoulder-width apart on the object. Your left / right hand should be palm-up, and your other hand should be palm-down. 2. Push the stick to raise your left / right arm out to your side and then over your head. Use your other hand to help move the stick. Stop when you feel a stretch in your shoulder, or when you reach the angle that is recommended by your health care provider. ? Avoid shrugging your shoulder while you raise your arm. Keep your shoulder blade tucked down toward your spine. 3. Hold for 30 seconds. 4. Slowly return to the starting position. Repeat 2 times. Complete this exercise 3 times per week.  Exercise B: Abduction, supine    1. Lie on your back and hold a broomstick, a cane, or a similar object. Place your hands a little more than shoulder-width apart on  the object. Your left / right hand should be palm-up, and your other hand should be palm-down. 2. Push the stick to raise your left / right arm out to your side and then over your head. Use your other hand to help move the stick. Stop when you feel a stretch in your shoulder, or when you reach the angle that is recommended by your health care provider. ? Avoid shrugging your shoulder while you raise your arm. Keep your shoulder blade tucked down toward your spine. 3. Hold for 30 seconds. 4. Slowly return to the starting position. Repeat 2 times. Complete this exercise 3 times per week.  Exercise C: Flexion, active-assisted    1. Lie on your back. You may bend your knees for comfort. 2. Hold a broomstick, a cane, or a similar object. Place your hands about shoulder-width apart on the object. Your palms should face toward your feet. 3. Raise the stick and move your arms over your head and behind your head, toward the floor. Use your healthy arm to help your left / right arm move farther. Stop when you feel a gentle stretch in your shoulder, or when you reach the angle where your health care provider tells you to stop. 4. Hold for 30 seconds. 5. Slowly return to the starting position. Repeat 2 times. Complete this exercise 3 times per week.  Exercise D: External rotation and abduction  1. Stand in a door frame with one of your feet slightly in front of the other. This is called a staggered stance. 2. Choose one of the following positions as told by your health care provider: ? Place your hands and forearms on the door frame above your head. ? Place your hands and forearms on the door frame at the height of your head. ? Place your hands on the door frame at the height of your elbows. 3. Slowly move your weight onto your front foot until you feel a stretch across your chest and in the front of your shoulders. Keep your head and chest upright and keep your abdominal muscles tight. 4. Hold for  30 seconds. 5. To release the stretch, shift your weight to your back foot. Repeat 2 times. Complete this stretch 3 times per week.  Strengthening exercises These exercises build strength and endurance in your shoulder. Endurance is the ability to use your muscles for a long time, even after your muscles get tired. Exercise E: Scapular depression and adduction  1. Sit on a stable chair. Support your arms in front of you with pillows, armrests, or a tabletop. Keep your elbows in line with the sides of your body. 2. Gently move your shoulder blades down toward your middle back. Relax the muscles on the tops of your shoulders and in the back of your neck. 3. Hold for 3 seconds. 4. Slowly release the tension and relax your muscles completely before doing this exercise again. Repeat for a total of 10 repetitions. 5. After you have practiced this exercise, try doing the exercise without the arm support. Then, try the exercise while standing instead of sitting. Repeat 2 times. Complete this exercise 3 times per week.  Exercise F: Shoulder abduction, isometric    1. Stand or sit about 4-6 inches (10-15 cm) from a wall with your left / right side facing the wall. 2. Bend your left / right elbow and gently press your elbow against the wall. 3. Increase the pressure slowly until you are pressing as hard as you can without shrugging your shoulder. 4. Hold for 3 seconds. 5. Slowly release the tension and relax your muscles completely. Repeat for a total of 10 repetitions. Repeat 2 times. Complete this exercise 3 times per week.  Exercise G: Shoulder flexion, isometric    1. Stand or sit about 4-6 inches (10-15 cm) away from a wall with your left / right side facing the wall. 2. Keep your left / right elbow straight and gently press the top of your fist against the wall. Increase the pressure slowly until you are pressing as hard as you can without shrugging your shoulder. 3. Hold for 10-15  seconds. 4. Slowly release the tension and relax your muscles completely. Repeat for a total of 10 repetitions. Repeat 2 times. Complete this exercise 3 times per week.  Exercise H: Internal rotation    1. Sit in a stable chair without armrests, or stand. Secure an exercise band at your left / right side, at elbow height. 2. Place a soft object, such as a folded towel or a small pillow, under your left / right upper arm so your elbow is a few inches (about 8 cm) away from your side. 3. Hold the end of the exercise band so the band stretches. 4. Keeping your elbow pressed against the soft object under your arm, move your forearm across your body toward your abdomen. Keep your body steady so the movement  is only coming from your shoulder. 5. Hold for 3 seconds. 6. Slowly return to the starting position. Repeat for a total of 10 repetitions. Repeat 2 times. Complete this exercise 3 times per week.  Exercise I: External rotation    1. Sit in a stable chair without armrests, or stand. 2. Secure an exercise band at your left / right side, at elbow height. 3. Place a soft object, such as a folded towel or a small pillow, under your left / right upper arm so your elbow is a few inches (about 8 cm) away from your side. 4. Hold the end of the exercise band so the band stretches. 5. Keeping your elbow pressed against the soft object under your arm, move your forearm out, away from your abdomen. Keep your body steady so the movement is only coming from your shoulder. 6. Hold for 3 seconds. 7. Slowly return to the starting position. Repeat for a total of 10 repetitions. Repeat 2 times. Complete this exercise 3 times per week. Exercise J: Shoulder extension  1. Sit in a stable chair without armrests, or stand. Secure an exercise band to a stable object in front of you so the band is at shoulder height. 2. Hold one end of the exercise band in each hand. Your palms should face each  other. 3. Straighten your elbows and lift your hands up to shoulder height. 4. Step back, away from the secured end of the exercise band, until the band stretches. 5. Squeeze your shoulder blades together and pull your hands down to the sides of your thighs. Stop when your hands are straight down by your sides. Do not let your hands go behind your body. 6. Hold for 3 seconds. 7. Slowly return to the starting position. Repeat for a total of 10 repetitions. Repeat 2 times. Complete this exercise 3 times per week.  Exercise K: Shoulder extension, prone    1. Lie on your abdomen on a firm surface so your left / right arm hangs over the edge. 2. Hold a 5 lb weight in your hand so your palm faces in toward your body. Your arm should be straight. 3. Squeeze your shoulder blade down toward the middle of your back. 4. Slowly raise your arm behind you, up to the height of the surface that you are lying on. Keep your arm straight. 5. Hold for 3 seconds. 6. Slowly return to the starting position and relax your muscles. Repeat for a total of 10 repetitions. Repeat 2 times. Complete this exercise 3 times per week.   Exercise L: Horizontal abduction, prone  1. Lie on your abdomen on a firm surface so your left / right arm hangs over the edge. 2. Hold a 5 lb weight in your hand so your palm faces toward your feet. Your arm should be straight. 3. Squeeze your shoulder blade down toward the middle of your back. 4. Bend your elbow so your hand moves up, until your elbow is bent to an "L" shape (90 degrees). With your elbow bent, slowly move your forearm forward and up. Raise your hand up to the height of the surface that you are lying on. ? Your upper arm should not move, and your elbow should stay bent. ? At the top of the movement, your palm should face the floor. 5. Hold for 3 seconds. 6. Slowly return to the starting position and relax your muscles. Repeat for a total of 10 repetitions. Repeat 2 times.  Complete this  exercise 3 times per week.  Exercise M: Horizontal abduction, standing  1. Sit on a stable chair, or stand. 2. Secure an exercise band to a stable object in front of you so the band is at shoulder height. 3. Hold one end of the exercise band in each hand. 4. Straighten your elbows and lift your hands straight in front of you, up to shoulder height. Your palms should face down, toward the floor. 5. Step back, away from the secured end of the exercise band, until the band stretches. 6. Move your arms out to your sides, and keep your arms straight. 7. Hold for 3 seconds. 8. Slowly return to the starting position. Repeat for a total of 10 repetitions. Repeat 2 times. Complete this exercise 3 times per week.  Exercise N: Scapular retraction and elevation  1. Sit on a stable chair, or stand. 2. Secure an exercise band to a stable object in front of you so the band is at shoulder height. 3. Hold one end of the exercise band in each hand. Your palms should face each other. 4. Sit in a stable chair without armrests, or stand. 5. Step back, away from the secured end of the exercise band, until the band stretches. 6. Squeeze your shoulder blades together and lift your hands over your head. Keep your elbows straight. 7. Hold for 3 seconds. 8. Slowly return to the starting position. Repeat for a total of 10 repetitions. Repeat 2 times. Complete this exercise 3 times per week.  This information is not intended to replace advice given to you by your health care provider. Make sure you discuss any questions you have with your health care provider. Document Released: 01/20/2005 Document Revised: 09/27/2015 Document Reviewed: 12/07/2014 Elsevier Interactive Patient Education  2017 [employer].

## 2019-05-13 NOTE — Progress Notes (Signed)
Musculoskeletal Exam  Patient: William Alvarez DOB: 1970/05/10  DOS: 05/13/2019  SUBJECTIVE:  Chief Complaint:   Neck/shoulder pain  William Alvarez is a 49 y.o.  male for evaluation and treatment of Neck/shoulder pain.   Onset:  6 weeks ago. No inj or change in activity.  Location: R shoulder/trap area Character:  aching  Progression of issue:  is unchanged Associated symptoms: Tingling in fingers Treatment: to date has been TheraPatch, Tylenol, Ibuprofen.   Neurovascular symptoms: +numbness/paresthesias; no weakness   Past Medical History:  Diagnosis Date  . Depression   . GERD (gastroesophageal reflux disease)     Objective: VITAL SIGNS: BP (!) 152/81 (BP Location: Left Arm, Patient Position: Sitting, Cuff Size: Small)   Pulse 72   Temp 99.3 F (37.4 C) (Temporal)   Resp 14   Ht 5\' 11"  (1.803 m)   Wt 180 lb 6.4 oz (81.8 kg)   SpO2 99%   BMI 25.16 kg/m  Constitutional: Well formed, well developed. No acute distress. Cardiovascular: Brisk cap refill Thorax & Lungs: No accessory muscle use Musculoskeletal: R trap/shoulder.   Normal active range of motion: yes.   Normal passive range of motion: yes Tenderness to palpation: mild ttp over R trap Deformity: no Ecchymosis: no Shoulder exam neg.  Neurologic: Normal sensory function. No focal deficits noted. DTR's equal and symmetric in UE's. No clonus. Neg Spurling's b/l.  Psychiatric: Normal mood. Age appropriate judgment and insight. Alert & oriented x 3.    Assessment:  Strain of right trapezius muscle, initial encounter  Plan: Heat, ice, stretches, ibuprofen, Tylenol, activity as tolerated.  If no improvement over the next 3-4 weeks, he will let know and we will set him up with physical therapy.  I do not think he is cervical stenosis. F/u as originally scheduled. The patient voiced understanding and agreement to the plan.   [place], DO 05/14/19  9:37 AM

## 2019-05-14 ENCOUNTER — Encounter: Payer: Self-pay | Admitting: Family Medicine

## 2019-07-18 ENCOUNTER — Other Ambulatory Visit: Payer: Self-pay | Admitting: Family Medicine

## 2019-07-18 DIAGNOSIS — G4700 Insomnia, unspecified: Secondary | ICD-10-CM

## 2019-07-18 NOTE — Telephone Encounter (Signed)
Last Refill 04/14/2019  #90 no refills. Last Office visit on 05/13/2019

## 2019-12-20 ENCOUNTER — Other Ambulatory Visit: Payer: Self-pay | Admitting: Family Medicine

## 2019-12-20 DIAGNOSIS — L219 Seborrheic dermatitis, unspecified: Secondary | ICD-10-CM

## 2019-12-20 MED ORDER — TRIAMCINOLONE ACETONIDE 0.1 % EX CREA: 1.0000 "application " | TOPICAL_CREAM | Freq: Two times a day (BID) | CUTANEOUS | 0 refills | Status: DC

## 2019-12-20 MED ORDER — KETOCONAZOLE 2 % EX SHAM: MEDICATED_SHAMPOO | CUTANEOUS | 0 refills | Status: DC

## 2020-01-17 ENCOUNTER — Other Ambulatory Visit: Payer: Self-pay | Admitting: Family Medicine

## 2020-01-17 DIAGNOSIS — L219 Seborrheic dermatitis, unspecified: Secondary | ICD-10-CM

## 2020-07-03 ENCOUNTER — Other Ambulatory Visit: Payer: Self-pay | Admitting: Family Medicine

## 2020-07-03 DIAGNOSIS — G4700 Insomnia, unspecified: Secondary | ICD-10-CM

## 2020-08-03 ENCOUNTER — Other Ambulatory Visit: Payer: Self-pay | Admitting: Family Medicine

## 2020-08-03 DIAGNOSIS — L219 Seborrheic dermatitis, unspecified: Secondary | ICD-10-CM

## 2020-10-26 ENCOUNTER — Other Ambulatory Visit: Payer: Self-pay

## 2020-10-26 DIAGNOSIS — L219 Seborrheic dermatitis, unspecified: Secondary | ICD-10-CM
